# Patient Record
Sex: Female | Born: 1970 | Race: White | Hispanic: No | Marital: Married | Smoking: Never smoker
Health system: Southern US, Community
[De-identification: ages and names within clinical notes are randomized; demographics above are authoritative.]

## PROBLEM LIST (undated history)

## (undated) DIAGNOSIS — I493 Ventricular premature depolarization: Secondary | ICD-10-CM

## (undated) DIAGNOSIS — E785 Hyperlipidemia, unspecified: Secondary | ICD-10-CM

## (undated) DIAGNOSIS — G4730 Sleep apnea, unspecified: Secondary | ICD-10-CM

## (undated) DIAGNOSIS — E282 Polycystic ovarian syndrome: Secondary | ICD-10-CM

## (undated) HISTORY — DX: Ventricular premature depolarization: I49.3

## (undated) HISTORY — DX: Sleep apnea, unspecified: G47.30

## (undated) HISTORY — DX: Polycystic ovarian syndrome: E28.2

## (undated) HISTORY — DX: Hyperlipidemia, unspecified: E78.5

---
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
## 2006-03-19 HISTORY — PX: UTERINE FIBROID SURGERY: SHX826

## 2007-12-30 HISTORY — PX: LASIK: SHX215

## 2008-03-20 ENCOUNTER — Ambulatory Visit: Payer: Self-pay | Admitting: Internal Medicine

## 2008-10-11 HISTORY — PX: HERNIA REPAIR: SHX51

## 2011-08-06 ENCOUNTER — Ambulatory Visit: Payer: Self-pay | Admitting: Internal Medicine

## 2011-09-10 ENCOUNTER — Ambulatory Visit: Payer: Self-pay | Admitting: Obstetrics and Gynecology

## 2012-09-15 ENCOUNTER — Ambulatory Visit: Payer: Self-pay | Admitting: Obstetrics and Gynecology

## 2013-07-29 ENCOUNTER — Ambulatory Visit: Payer: Self-pay | Admitting: Specialist

## 2013-09-20 ENCOUNTER — Ambulatory Visit: Payer: Self-pay | Admitting: Obstetrics and Gynecology

## 2014-08-22 ENCOUNTER — Other Ambulatory Visit: Payer: Self-pay | Admitting: Internal Medicine

## 2014-08-22 LAB — D-DIMER(ARMC): D-Dimer: 284 ng/ml

## 2014-09-27 ENCOUNTER — Ambulatory Visit: Payer: Self-pay | Admitting: Internal Medicine

## 2014-09-28 HISTORY — PX: BREAST BIOPSY: SHX20

## 2014-10-02 ENCOUNTER — Ambulatory Visit: Payer: Self-pay | Admitting: Internal Medicine

## 2014-10-04 DIAGNOSIS — E8881 Metabolic syndrome: Secondary | ICD-10-CM | POA: Insufficient documentation

## 2014-10-19 ENCOUNTER — Ambulatory Visit (INDEPENDENT_AMBULATORY_CARE_PROVIDER_SITE_OTHER): Payer: BC Managed Care – PPO | Admitting: General Surgery

## 2014-10-19 ENCOUNTER — Encounter: Payer: Self-pay | Admitting: General Surgery

## 2014-10-19 VITALS — BP 128/72 | HR 74 | Resp 14 | Ht 63.0 in | Wt 271.0 lb

## 2014-10-19 DIAGNOSIS — R928 Other abnormal and inconclusive findings on diagnostic imaging of breast: Secondary | ICD-10-CM

## 2014-10-19 NOTE — Patient Instructions (Addendum)
Continue self breast exams. Call office for any new breast issues or concerns.  Stereotactic Breast Biopsy A stereotactic breast biopsy is a procedure in which mammography is used in the collection of a sample of breast tissue. Mammography is a type of X-ray exam of the breasts that produces an image called a mammogram. The mammogram allows your health care provider to precisely locate the area of the breast from which a tissue sample will be taken. The tissue is then examined under a microscope to see if cancerous cells are present. A breast biopsy is done when:   A lump, abnormality, or mass is seen in the breast on a breast X-ray (mammogram).   Small calcium deposits (calcifications) are seen in the breast.   The shape or appearance of the breasts changes.   The shape or appearance of the nipples changes. You may have unusual or bloody discharge coming from the nipples, or you may have crusting, retraction, or dimpling of the nipples. A breast biopsy can indicate if you need surgery or other treatment.  LET YOUR HEALTH CARE PROVIDER KNOW ABOUT:  Any allergies you have.  All medicines you are taking, including vitamins, herbs, eye drops, creams, and over-the-counter medicines.  Previous problems you or members of your family have had with the use of anesthetics.  Any blood disorders you have.  Previous surgeries you have had.  Medical conditions you have. RISKS AND COMPLICATIONS Generally, stereotactic breast biopsy is a safe procedure. However, as with any procedure, complications can occur. Possible complications include:  Infection at the needle-insertion site.   Bleeding or bruising after surgery.  The breast may become altered or deformed as a result of the procedure.  The needle may go through the chest wall into the lung area.  BEFORE THE PROCEDURE  Wear a supportive bra to the procedure.  You will be asked to remove jewelry, dentures, eyeglasses, metal objects,  or clothing that might interfere with the X-ray images. You may want to leave some of these objects at home.  Arrange for someone to drive you home after the procedure if desired. PROCEDURE  A stereotactic breast biopsy is done while you are awake. During the procedure, relax as much as possible. Let your health care provider know if you are uncomfortable, anxious, or in pain. Usually, the only discomfort felt during the procedure is caused by staying in one position for the length of the procedure. This discomfort can be reduced by carefully placed cushions. Most of the time the biopsy is done using a table with openings on it. You will be asked to lie facedown on the table and place your breasts through the openings. Your breast is compressed between metal plates to get good X-ray images. Your skin will be cleaned, and a numbing medicine (local anesthetic) will be injected. A small cut (incision) will be made in your breast. The tip of the biopsy needle will be directed through the incision. Several small pieces of suspicious tissue will be taken. Then, a final set of X-ray images will be obtained. If they show that the suspicious tissue has been mostly or completely removed, a small clip will be left at the biopsy site. This is done so that the biopsy site can be easily located if the results of the biopsy show that the tissue is cancerous.  After the procedure, the incision will be stitched (sutured) or taped and covered with a bandage (dressing). Your health care provider may apply a pressure dressing and   an ice pack to prevent bleeding and swelling in the breast.  A stereotactic breast biopsy can take 30 minutes or more. AFTER THE PROCEDURE  If you are doing well and have no problems, you will be allowed to go home.  Document Released: 09/13/2003 Document Revised: 12/20/2013 Document Reviewed: 07/14/2013 ExitCare Patient Information 2015 [employer]. This information is not intended to replace  advice given to you by your health care provider. Make sure you discuss any questions you have with your health care provider.  

## 2014-10-19 NOTE — Progress Notes (Signed)
Patient ID: Erin Miller, female   DOB: 11-20-1971, 43 y.o.   MRN: [id number]  Chief Complaint  Patient presents with  . Other    New Pt Cat 3 mammogram    HPI Erin Miller is a 43 y.o. female.  who presents for a breast evaluation. The most recent mammogram was 09-27-14. Left breast mammogram and ultrasound was done on 10-02-14.  Patient does not consistently perform regular self breast checks but she gets regular mammograms done.  Denies any breast trauma or injury. Denies family history of breast cancer. She states they told her that it was a concern for a lymph node in the lateral left breast. She states she can not feel the node. Her two girls are 7 and 5 and she did breast feed the last for 15 months. Husband is Dallas Schimke, RN works in the Education [employer] at [employer].   HPI  Past Medical History  Diagnosis Date  . Hyperlipidemia   . Sleep apnea   . Polycystic ovarian syndrome   . PVC (premature ventricular contraction)     Past Surgical History  Procedure Laterality Date  . Appendectomy  1981  . Uterine fibroid surgery  03-19-06  . Lasik Bilateral 2009  . Hernia repair  [id number]    umbilical  . Cesarean section  2008, 2010    No family history on file.  Social History History  Substance Use Topics  . Smoking status: Never Smoker   . Smokeless tobacco: Never Used  . Alcohol Use: No    Allergies  Allergen Reactions  . Sulfa Antibiotics Rash    Current Outpatient Prescriptions  Medication Sig Dispense Refill  . acetaminophen (TYLENOL) 325 MG tablet Take 650 mg by mouth every 6 (six) hours as needed.      Marland Kitchen atorvastatin (LIPITOR) 10 MG tablet Take 10 mg by mouth daily.       Marland Kitchen ibuprofen (ADVIL,MOTRIN) 200 MG tablet Take 200 mg by mouth every 6 (six) hours as needed.      . loratadine (CLARITIN) 10 MG tablet Take 1 tablet by mouth once a day as directed [For sneezing/allergy]      . magnesium oxide (MAG-OX) 400 MG tablet Take 400 mg by mouth daily.       .  metFORMIN (GLUCOPHAGE) 1000 MG tablet Take 1,000 mg by mouth 3 (three) times daily.       . metoprolol succinate (TOPROL-XL) 25 MG 24 hr tablet Take 25 mg by mouth daily.       . Multiple Vitamin (MULTIVITAMIN) capsule Take 1 capsule by mouth daily.      . pseudoephedrine (SUDAFED) 30 MG tablet Take 30 mg by mouth every 4 (four) hours as needed for congestion.       No current facility-administered medications for this visit.    Review of Systems Review of Systems  Blood pressure 128/72, pulse 74, resp. rate 14, height 5\' 3"  (1.6 m), weight 271 lb (122.925 kg), last menstrual period 10/19/2014.  Physical Exam Physical Exam  Constitutional: She is oriented to person, place, and time. She appears well-developed and well-nourished.  Neck: Neck supple.  Cardiovascular: Normal rate, regular rhythm and normal heart sounds.   Pulmonary/Chest: Effort normal and breath sounds normal. Right breast exhibits no inverted nipple, no mass, no nipple discharge, no skin change and no tenderness. Left breast exhibits no inverted nipple, no mass, no nipple discharge, no skin change and no tenderness.  Lymphadenopathy:    She has no cervical  adenopathy.    She has no axillary adenopathy.  Neurological: She is alert and oriented to person, place, and time.  Skin: Skin is warm and dry.    Data Reviewed Mammograms and ultrasounds from 2013 to present.  Area visualized on MLO view this year not encompassed in 2013 films, but should have been seen in 2014. Mammogram identified two areas in the UOQ of the left breast, only one noted on ultrasound.2  Assessment    Possible lymph node vs more sinister process.     Plan    The posterior location of the lesion (s) may make stereotactic biopsy difficult on the [facility] equipment.  Options reviewed: Observation as recommended by radiologist, ultrasound biopsy of the area w/ post biopsy mammograms to confirm area on mammogram correlates with ultrasound or attempted  stereotactic biopsy to assess the area that triggered the present cascade of imaging studies, with fallback to ultrasound/ open biopsy if not assessable.  This is a low probability lesion, but the patient is anxious, she will likely require biopsy for peace of mind.      Follow up    PCP/Ref: Damaris Hippo 10/22/2014, 8:03 AM

## 2014-10-22 DIAGNOSIS — R928 Other abnormal and inconclusive findings on diagnostic imaging of breast: Secondary | ICD-10-CM | POA: Insufficient documentation

## 2014-10-26 ENCOUNTER — Telehealth: Payer: Self-pay

## 2014-10-26 NOTE — Telephone Encounter (Signed)
The patient called and had some questions regarding her biopsy options that you had spoken with her about. She specifically wanted to know what would happen if a stereo biopsy was unsuccessful and if the secondary ultrasound biopsy could be done while she is there already numbed up. She asked that you call her on her mobile phone.

## 2014-10-30 ENCOUNTER — Encounter: Payer: Self-pay | Admitting: *Deleted

## 2014-10-30 ENCOUNTER — Encounter: Payer: Self-pay | Admitting: General Surgery

## 2014-10-30 NOTE — Telephone Encounter (Signed)
Patient desires to proceed with biopsy. Will set up for stereo, and if the area is too posterior to biopsy, will arrange to have her come to the office for an ultrasound and ultrasound guided biopsy.

## 2014-10-30 NOTE — Progress Notes (Signed)
Patient ID: Erin Miller, female   DOB: 13-Oct-1971, 43 y.o.   MRN: [id number]  Patient has been scheduled for a left breast stereotactic biopsy at [facility] for 11-06-14 at 3 pm. She will check-in at the [facility] at 2:30 pm. This patient is aware of date, time, and instructions. Patient verbalizes understanding.

## 2014-11-06 ENCOUNTER — Ambulatory Visit: Payer: Self-pay | Admitting: General Surgery

## 2014-11-06 DIAGNOSIS — R920 Mammographic microcalcification found on diagnostic imaging of breast: Secondary | ICD-10-CM

## 2014-11-07 ENCOUNTER — Telehealth: Payer: Self-pay | Admitting: General Surgery

## 2014-11-07 ENCOUNTER — Encounter: Payer: Self-pay | Admitting: General Surgery

## 2014-11-07 NOTE — Telephone Encounter (Signed)
Pathology showed only a lymph node. Patient reports modest soreness today as well as a spot of blood on the dressing this AM. F/U next week for staff wound check is in place.

## 2014-11-08 ENCOUNTER — Encounter: Payer: Self-pay | Admitting: General Surgery

## 2014-11-13 ENCOUNTER — Ambulatory Visit (INDEPENDENT_AMBULATORY_CARE_PROVIDER_SITE_OTHER): Payer: Self-pay | Admitting: *Deleted

## 2014-11-13 DIAGNOSIS — R928 Other abnormal and inconclusive findings on diagnostic imaging of breast: Secondary | ICD-10-CM

## 2014-11-13 NOTE — Progress Notes (Signed)
Patient here today for follow up post left  breast stereo. She has a rash wear the tegaderm was. Patient will try some hydrocortisone cream.   Minimal bruising noted.  The patient is aware that a heating pad may be used for comfort as needed.  Aware of pathology. Follow up as scheduled.

## 2015-02-22 ENCOUNTER — Ambulatory Visit: Payer: Self-pay | Admitting: Internal Medicine

## 2015-03-05 ENCOUNTER — Telehealth: Payer: Self-pay | Admitting: *Deleted

## 2015-03-05 NOTE — Telephone Encounter (Signed)
Patient called and was under the assumption that since she had a stereo biopsy in October she was supposed to be back on her yearly mammogram schedule. She is scheduled for a Uni L Diag Mammo on 04/19/15 R92.8. Please advise if she needs to keep this appointment or be in Recalls for October.

## 2015-03-05 NOTE — Telephone Encounter (Signed)
Patient can resume annual screening mammograms with her primary care provider in fall 2016. She is welcome to return if new issues identified.

## 2015-04-19 ENCOUNTER — Ambulatory Visit: Payer: BC Managed Care – PPO | Admitting: General Surgery

## 2015-04-21 NOTE — Op Note (Signed)
PATIENT NAME:  Erin Miller, Erin Miller MR#:  [id number] DATE OF BIRTH:  March 17, 1971  DATE OF PROCEDURE:  11/06/2014  PREOPERATIVE DIAGNOSIS: Abnormal left breast mammogram.   POSTOPERATIVE DIAGNOSIS: Abnormal left breast mammogram.   OPERATIVE PROCEDURE: Left breast stereotactic biopsy.   SURGEON: Hervey Ard, MD  ANESTHESIA: 1% Xylocaine with 1:200,000 units epinephrine, 30 mL.   ESTIMATED BLOOD LOSS: Less than 5 mL.   CLINICAL NOTE: This 44 year old woman recently had a mammogram showing a nodular density well posterior and lateral in the left breast. Ultrasound reported a visualized lesion, but on review of the images the area was much better seen on mammography. She was felt to be a candidate for attempted stereotactic biopsy. The possibility of an inability to biopsy based on location had been reviewed.   DESCRIPTION OF PROCEDURE: With the patient on the mammogram stereotactic table, it was necessary to reposition her to bring the left arm through the aperture at which time visualization was obtained on the scout as well as the -15 view. The lesion was just seen at the +15 view. The area was targeted after local anesthetic was infiltrated, and the initial targeting showed the needle well below the area. Repeat targeting was undertaken and the second incision was approximately 1.5 cm superior to the first. The needle was advanced and the patient experienced discomfort after needle advancement. This was managed with supplemental local anesthetic. Approximately 12 core samples were obtained, with additional local anesthetic injected multiple times. Post-biopsy imaging showed a blood pocket corresponding to the site of the previous mass, especially appreciated on the +15 view. A cylinder clip was placed. Post-biopsy mammograms are pending at the time of this dictation.   The patient will be contacted when pathology is available.  ____________________________ Robert Bellow,  MD jwb:sb D: 11/06/2014 16:22:49 ET T: 11/06/2014 17:23:52 ET JOB#: [id number]  cc: Robert Bellow, MD, <Dictator> Leonie Douglas. Doy Hutching, MD Marcea Rojek Amedeo Kinsman MD ELECTRONICALLY SIGNED 11/06/2014 20:13

## 2015-04-23 LAB — SURGICAL PATHOLOGY

## 2015-09-04 ENCOUNTER — Other Ambulatory Visit: Payer: Self-pay | Admitting: Internal Medicine

## 2015-09-04 DIAGNOSIS — Z9889 Other specified postprocedural states: Secondary | ICD-10-CM

## 2015-10-01 ENCOUNTER — Other Ambulatory Visit: Payer: Self-pay

## 2015-10-01 ENCOUNTER — Ambulatory Visit: Payer: Self-pay

## 2015-10-04 ENCOUNTER — Other Ambulatory Visit: Payer: Self-pay | Admitting: Internal Medicine

## 2015-10-04 ENCOUNTER — Ambulatory Visit
Admission: RE | Admit: 2015-10-04 | Discharge: 2015-10-04 | Disposition: A | Discharge status: Home / Self Care | Payer: BC Managed Care – PPO | Source: Ambulatory Visit | Attending: Internal Medicine | Admitting: Internal Medicine

## 2015-10-04 DIAGNOSIS — R928 Other abnormal and inconclusive findings on diagnostic imaging of breast: Secondary | ICD-10-CM | POA: Diagnosis not present

## 2015-10-04 DIAGNOSIS — Z9889 Other specified postprocedural states: Secondary | ICD-10-CM

## 2015-11-14 ENCOUNTER — Emergency Department: Payer: BC Managed Care – PPO

## 2015-11-14 ENCOUNTER — Emergency Department
Admission: EM | Admit: 2015-11-14 | Discharge: 2015-11-14 | Disposition: A | Discharge status: Home / Self Care | Payer: BC Managed Care – PPO | Attending: Emergency Medicine | Admitting: Emergency Medicine

## 2015-11-14 DIAGNOSIS — R0602 Shortness of breath: Secondary | ICD-10-CM | POA: Insufficient documentation

## 2015-11-14 DIAGNOSIS — R079 Chest pain, unspecified: Secondary | ICD-10-CM | POA: Diagnosis not present

## 2015-11-14 DIAGNOSIS — Z7984 Long term (current) use of oral hypoglycemic drugs: Secondary | ICD-10-CM | POA: Diagnosis not present

## 2015-11-14 DIAGNOSIS — Z79899 Other long term (current) drug therapy: Secondary | ICD-10-CM | POA: Diagnosis not present

## 2015-11-14 DIAGNOSIS — E119 Type 2 diabetes mellitus without complications: Secondary | ICD-10-CM | POA: Diagnosis not present

## 2015-11-14 LAB — BASIC METABOLIC PANEL
ANION GAP: 8 (ref 5–15)
BUN: 13 mg/dL (ref 6–20)
CALCIUM: 9.3 mg/dL (ref 8.9–10.3)
CHLORIDE: 107 mmol/L (ref 101–111)
CO2: 25 mmol/L (ref 22–32)
Creatinine, Ser: 0.92 mg/dL (ref 0.44–1.00)
GFR calc Af Amer: >60 mL/min (ref >60)
GFR calc non Af Amer: >60 mL/min (ref >60)
GLUCOSE: 104 mg/dL — AB (ref 65–99)
Potassium: 4.2 mmol/L (ref 3.5–5.1)
Sodium: 140 mmol/L (ref 135–145)

## 2015-11-14 LAB — CBC
HCT: 39.9 % (ref 35.0–47.0)
Hemoglobin: 13.1 g/dL (ref 12.0–16.0)
MCH: 28.3 pg (ref 26.0–34.0)
MCHC: 32.8 g/dL (ref 32.0–36.0)
MCV: 86.4 fL (ref 80.0–100.0)
PLATELETS: 201 10*3/uL (ref 150–440)
RBC: 4.62 MIL/uL (ref 3.80–5.20)
RDW: 14.7 % — AB (ref 11.5–14.5)
WBC: 7.9 10*3/uL (ref 3.6–11.0)

## 2015-11-14 LAB — TROPONIN I

## 2015-11-14 LAB — FIBRIN DERIVATIVES D-DIMER (ARMC ONLY): Fibrin derivatives D-dimer (ARMC): 400 (ref 0–499)

## 2015-11-14 NOTE — Discharge Instructions (Signed)
You have been seen in the emergency department today for chest pain. Your workup has shown normal results. As we discussed please follow-up with your primary care physician in the next 1-2 days for recheck. Return to the emergency department for any further chest pain, trouble breathing, or any other symptom personally concerning to yourself. ° °Nonspecific Chest Pain °It is often hard to find the cause of chest pain. There is always a chance that your pain could be related to something serious, such as a heart attack or a blood clot in your lungs. Chest pain can also be caused by conditions that are not life-threatening. If you have chest pain, it is very important to follow up with your doctor. ° °HOME CARE °· If you were prescribed an antibiotic medicine, finish it all even if you start to feel better. °· Avoid any activities that cause chest pain. °· Do not use any tobacco products, including cigarettes, chewing tobacco, or electronic cigarettes. If you need help quitting, ask your doctor. °· Do not drink alcohol. °· Take medicines only as told by your doctor. °· Keep all follow-up visits as told by your doctor. This is important. This includes any further testing if your chest pain does not go away. °· Your doctor may tell you to keep your head raised (elevated) while you sleep. °· Make lifestyle changes as told by your doctor. These may include: °¨ Getting regular exercise. Ask your doctor to suggest some activities that are safe for you. °¨ Eating a heart-healthy diet. Your doctor or a diet specialist (dietitian) can help you to learn healthy eating options. °¨ Maintaining a healthy weight. °¨ Managing diabetes, if necessary. °¨ Reducing stress. °GET HELP IF: °· Your chest pain does not go away, even after treatment. °· You have a rash with blisters on your chest. °· You have a fever. °GET HELP RIGHT AWAY IF: °· Your chest pain is worse. °· You have an increasing cough, or you cough up blood. °· You have  severe belly (abdominal) pain. °· You feel extremely weak. °· You pass out (faint). °· You have chills. °· You have sudden, unexplained chest discomfort. °· You have sudden, unexplained discomfort in your arms, back, neck, or jaw. °· You have shortness of breath at any time. °· You suddenly start to sweat, or your skin gets clammy. °· You feel nauseous. °· You vomit. °· You suddenly feel light-headed or dizzy. °· Your heart begins to beat quickly, or it feels like it is skipping beats. °These symptoms may be an emergency. Do not wait to see if the symptoms will go away. Get medical help right away. Call your local emergency services (911 in the [place] Do not drive yourself to the hospital. °  °This information is not intended to replace advice given to you by your health care provider. Make sure you discuss any questions you have with your health care provider. °  °Document Released: 06/02/2008 Document Revised: 01/05/2015 Document Reviewed: 07/21/2014 °Elsevier Interactive Patient Education ©2016 Elsevier Inc. ° °

## 2015-11-14 NOTE — ED Provider Notes (Signed)
[facility] Emergency Department Provider Note  Time seen: 3:49 PM  I have reviewed the triage vital signs and the nursing notes.   HISTORY  Chief Complaint Chest Pain    HPI Erin Miller is a 44 y.o. female with a past medical history of hyperlipidemia, diabetes, who presents to the emergency department with intermittent chest pain for the past 3 weeks. According to the patient for the past 3 weeks she has had intermittent right chest pains. She describes them as dull pains which come and go some last minutes some last hours. States she does have occasional shortness of breath somewhat worse with exertion. Denies any nausea or diaphoresis. Patient denies any current chest pains. Denies any current shortness of breath. Denies any recent cough or congestion or fever. Denies any leg pain or swelling or history of DVT.     Past Medical History  Diagnosis Date  . Hyperlipidemia   . Sleep apnea   . Polycystic ovarian syndrome   . PVC (premature ventricular contraction)   . Sleep apnea     Patient Active Problem List   Diagnosis Date Noted  . Abnormal mammogram of left breast 10/22/2014    Past Surgical History  Procedure Laterality Date  . Appendectomy  1981  . Uterine fibroid surgery  03-19-06  . Lasik Bilateral 2009  . Hernia repair  [id number]    umbilical  . Cesarean section  2008, 2010  . Breast biopsy Left 09/2014    stereo of left breast mass done by Dr. Bary Castilla. Benign    Current Outpatient Rx  Name  Route  Sig  Dispense  Refill  . acetaminophen (TYLENOL) 325 MG tablet   Oral   Take 650 mg by mouth every 6 (six) hours as needed.         Marland Kitchen atorvastatin (LIPITOR) 10 MG tablet   Oral   Take 10 mg by mouth daily.          Marland Kitchen ibuprofen (ADVIL,MOTRIN) 200 MG tablet   Oral   Take 200 mg by mouth every 6 (six) hours as needed.         . loratadine (CLARITIN) 10 MG tablet      Take 1 tablet by mouth once a day as directed [For  sneezing/allergy]         . metFORMIN (GLUCOPHAGE) 1000 MG tablet   Oral   Take 1,000 mg by mouth 3 (three) times daily.          . metoprolol succinate (TOPROL-XL) 25 MG 24 hr tablet   Oral   Take 25 mg by mouth daily.          . Multiple Vitamin (MULTIVITAMIN) capsule   Oral   Take 1 capsule by mouth daily.         . pseudoephedrine (SUDAFED) 30 MG tablet   Oral   Take 30 mg by mouth every 4 (four) hours as needed for congestion.           Allergies Sulfa antibiotics  No family history on file.  Social History Social History  Substance Use Topics  . Smoking status: Never Smoker   . Smokeless tobacco: Never Used  . Alcohol Use: No    Review of Systems Constitutional: Negative for fever. Cardiovascular: Positive for chest pain Respiratory: Positive for intermittent shortness of breath. Gastrointestinal: Negative for abdominal pain Musculoskeletal: Negative for back pain. Negative for leg pain or swelling. Skin: Negative for rash. Neurological: Negative for headache  10-point ROS otherwise negative.  ____________________________________________   PHYSICAL EXAM:  VITAL SIGNS: ED Triage Vitals  Enc Vitals Group     BP 11/14/15 1437 140/82 mmHg     Pulse Rate 11/14/15 1437 63     Resp 11/14/15 1437 18     Temp 11/14/15 1437 98.5 F (36.9 C)     Temp Source 11/14/15 1437 Oral     SpO2 11/14/15 1437 96 %     Weight 11/14/15 1437 265 lb (120.203 kg)     Height 11/14/15 1437 5\' 3"  (1.6 m)     Head Cir --      Peak Flow --      Pain Score 11/14/15 1437 2     Pain Loc --      Pain Edu? --      Excl. in [place]? --     Constitutional: Alert and oriented. Well appearing and in no distress. Eyes: Normal exam ENT   Head: Normocephalic and atraumatic.   Mouth/Throat: Mucous membranes are moist. Cardiovascular: Normal rate, regular rhythm. No murmur Respiratory: Normal respiratory effort without tachypnea nor retractions. Breath sounds are clear and  equal bilaterally. No wheezes/rales/rhonchi. No chest tenderness to palpation Gastrointestinal: Soft and nontender. No distention. Musculoskeletal: Nontender with normal range of motion in all extremities. No lower extremity tenderness or edema. Neurologic:  Normal speech and language. No gross focal neurologic deficits  Skin:  Skin is warm, dry and intact.  Psychiatric: Mood and affect are normal. Speech and behavior are normal.   ____________________________________________    EKG  EKG reviewed and interpreted by myself shows normal sinus rhythm at 63 bpm, narrow QRS, normal axis, normal intervals, no ST changes present. Normal EKG.  ____________________________________________    RADIOLOGY  No acute findings on chest x-ray  ____________________________________________    INITIAL IMPRESSION / ASSESSMENT AND PLAN / ED COURSE  Pertinent labs & imaging results that were available during my care of the patient were reviewed by me and considered in my medical decision making (see chart for details).  Patient presents the department intermittent right-sided chest pain and shortness of breath. Patient currently has a 96% room air saturation. Nonreproducible chest pain. Denies any chest pain or shortness of breath currently. Denies any pleuritic nature of the chest pain. Given the patient's lower oxygen saturations along with intermittent right-sided symptoms associated with shortness of breath we'll proceed with a d-dimer to rule out PE. Currently the patient's labs including troponin are negative. EKG looks normal, chest x-ray is normal. Patient states she's had several stress tests in the past due to various chest pain or arm symptoms develop and normal. Her last stress test she states was over 3 years ago.  D-dimer negative. We'll discharge home with PCP follow-up for stress test. Patient agreeable to plan.  ____________________________________________   FINAL CLINICAL  IMPRESSION(S) / ED DIAGNOSES  Chest pain   Harvest Dark, MD 11/14/15 [id number]

## 2015-11-14 NOTE — ED Notes (Signed)
Pt c/o intermittent chest pain that radiates into the back and shoulders over the past week, states she is having increased SOB with excursion.. Pt is in NAD at present

## 2015-11-14 NOTE — ED Notes (Signed)
Pt states chest pain on and off for several days, states some occasional SOB and dizziness, pt states she has been under stress but pain has been moving from under breast and rib cage, pt awake and alert in no acute distress, MD at bedside

## 2016-03-03 DIAGNOSIS — E669 Obesity, unspecified: Secondary | ICD-10-CM | POA: Insufficient documentation

## 2016-09-08 ENCOUNTER — Other Ambulatory Visit: Payer: Self-pay | Admitting: Internal Medicine

## 2016-09-08 DIAGNOSIS — Z1231 Encounter for screening mammogram for malignant neoplasm of breast: Secondary | ICD-10-CM

## 2016-10-15 ENCOUNTER — Ambulatory Visit
Admission: RE | Admit: 2016-10-15 | Discharge: 2016-10-15 | Disposition: A | Discharge status: Home / Self Care | Payer: BC Managed Care – PPO | Source: Ambulatory Visit | Attending: Internal Medicine | Admitting: Internal Medicine

## 2016-10-15 DIAGNOSIS — Z1231 Encounter for screening mammogram for malignant neoplasm of breast: Secondary | ICD-10-CM | POA: Diagnosis not present

## 2017-06-30 ENCOUNTER — Other Ambulatory Visit: Payer: Self-pay | Admitting: Obstetrics and Gynecology

## 2017-06-30 DIAGNOSIS — Z1231 Encounter for screening mammogram for malignant neoplasm of breast: Secondary | ICD-10-CM

## 2017-10-21 ENCOUNTER — Ambulatory Visit
Admission: RE | Admit: 2017-10-21 | Discharge: 2017-10-21 | Disposition: A | Discharge status: Home / Self Care | Payer: BC Managed Care – PPO | Source: Ambulatory Visit | Attending: Obstetrics and Gynecology | Admitting: Obstetrics and Gynecology

## 2017-10-21 DIAGNOSIS — Z1231 Encounter for screening mammogram for malignant neoplasm of breast: Secondary | ICD-10-CM

## 2018-09-23 ENCOUNTER — Other Ambulatory Visit: Payer: Self-pay | Admitting: Obstetrics and Gynecology

## 2018-09-23 ENCOUNTER — Other Ambulatory Visit: Payer: Self-pay | Admitting: Internal Medicine

## 2018-09-23 DIAGNOSIS — Z1231 Encounter for screening mammogram for malignant neoplasm of breast: Secondary | ICD-10-CM

## 2018-10-26 ENCOUNTER — Ambulatory Visit
Admission: RE | Admit: 2018-10-26 | Discharge: 2018-10-26 | Disposition: A | Discharge status: Home / Self Care | Payer: BC Managed Care – PPO | Source: Ambulatory Visit | Attending: Internal Medicine | Admitting: Internal Medicine

## 2018-10-26 DIAGNOSIS — Z1231 Encounter for screening mammogram for malignant neoplasm of breast: Secondary | ICD-10-CM | POA: Diagnosis not present

## 2019-08-31 ENCOUNTER — Other Ambulatory Visit: Payer: Self-pay | Admitting: Internal Medicine

## 2019-08-31 DIAGNOSIS — Z1231 Encounter for screening mammogram for malignant neoplasm of breast: Secondary | ICD-10-CM

## 2019-11-02 ENCOUNTER — Ambulatory Visit
Admission: RE | Admit: 2019-11-02 | Discharge: 2019-11-02 | Disposition: A | Discharge status: Home / Self Care | Payer: BC Managed Care – PPO | Source: Ambulatory Visit | Attending: Internal Medicine | Admitting: Internal Medicine

## 2019-11-02 DIAGNOSIS — Z1231 Encounter for screening mammogram for malignant neoplasm of breast: Secondary | ICD-10-CM | POA: Insufficient documentation

## 2020-03-16 ENCOUNTER — Ambulatory Visit: Payer: BC Managed Care – PPO | Attending: Internal Medicine

## 2020-03-16 DIAGNOSIS — Z23 Encounter for immunization: Secondary | ICD-10-CM

## 2020-03-16 NOTE — Progress Notes (Signed)
   Covid-19 Vaccination Clinic  Name:  Erin Miller    MRN: [id number] DOB: 10-30-1971  03/16/2020  Erin Miller was observed post Covid-19 immunization for 15 minutes without incident. She was provided with Vaccine Information Sheet and instruction to access the V-Safe system.   Erin Miller was instructed to call 911 with any severe reactions post vaccine: Marland Kitchen Difficulty breathing  . Swelling of face and throat  . A fast heartbeat  . A bad rash all over body  . Dizziness and weakness   Immunizations Administered    Name Date Dose VIS Date Route   Pfizer COVID-19 Vaccine 03/16/2020  1:52 PM 0.3 mL 12/09/2019 Intramuscular   Manufacturer: [place]   Lot: [id number]   [place]: [id number]

## 2020-04-11 ENCOUNTER — Ambulatory Visit: Payer: BC Managed Care – PPO | Attending: Internal Medicine

## 2020-04-11 DIAGNOSIS — Z23 Encounter for immunization: Secondary | ICD-10-CM

## 2020-04-11 NOTE — Progress Notes (Signed)
   Covid-19 Vaccination Clinic  Name:  Erin Miller    MRN: [id number] DOB: Jun 16, 1971  04/11/2020  [name] was observed post Covid-19 immunization for 15 minutes without incident. She was provided with Vaccine Information Sheet and instruction to access the V-Safe system.   [name] was instructed to call 911 with any severe reactions post vaccine: Marland Kitchen Difficulty breathing  . Swelling of face and throat  . A fast heartbeat  . A bad rash all over body  . Dizziness and weakness   Immunizations Administered    Name Date Dose VIS Date Route   Pfizer COVID-19 Vaccine 04/11/2020  3:08 PM 0.3 mL 12/09/2019 Intramuscular   Manufacturer: [place]   Lot: [id number]   [place]: [id number]

## 2020-05-22 ENCOUNTER — Other Ambulatory Visit: Payer: Self-pay

## 2020-05-22 DIAGNOSIS — I10 Essential (primary) hypertension: Secondary | ICD-10-CM | POA: Insufficient documentation

## 2020-05-22 DIAGNOSIS — E785 Hyperlipidemia, unspecified: Secondary | ICD-10-CM | POA: Insufficient documentation

## 2020-05-22 DIAGNOSIS — I493 Ventricular premature depolarization: Secondary | ICD-10-CM | POA: Insufficient documentation

## 2020-05-22 DIAGNOSIS — J45909 Unspecified asthma, uncomplicated: Secondary | ICD-10-CM | POA: Insufficient documentation

## 2020-05-22 DIAGNOSIS — E282 Polycystic ovarian syndrome: Secondary | ICD-10-CM | POA: Insufficient documentation

## 2020-05-22 DIAGNOSIS — N898 Other specified noninflammatory disorders of vagina: Secondary | ICD-10-CM | POA: Insufficient documentation

## 2020-05-24 ENCOUNTER — Telehealth (INDEPENDENT_AMBULATORY_CARE_PROVIDER_SITE_OTHER): Payer: Self-pay | Admitting: Gastroenterology

## 2020-05-24 DIAGNOSIS — Z1211 Encounter for screening for malignant neoplasm of colon: Secondary | ICD-10-CM

## 2020-05-24 NOTE — Progress Notes (Signed)
Gastroenterology Pre-Procedure Review  Request Date: 07/09/20 Requesting Physician: Dr. Allen Norris  PATIENT REVIEW QUESTIONS: The patient responded to the following health history questions as indicated:    1. Are you having any GI issues? no 2. Do you have a personal history of Polyps? no 3. Do you have a family history of Colon Cancer or Polyps? no 4. Diabetes Mellitus? no 5. Joint replacements in the past 12 months?no 6. Major health problems in the past 3 months?no 7. Any artificial heart valves, MVP, or defibrillator?no    MEDICATIONS & ALLERGIES:    Patient reports the following regarding taking any anticoagulation/antiplatelet therapy:   Plavix, Coumadin, Eliquis, Xarelto, Lovenox, Pradaxa, Brilinta, or Effient? no Aspirin? no  Patient confirms/reports the following medications:  Current Outpatient Medications  Medication Sig Dispense Refill  . atorvastatin (LIPITOR) 10 MG tablet Take 10 mg by mouth daily.     . Bacillus Coagulans-Inulin (ALIGN PREBIOTIC-PROBIOTIC PO) Take by mouth.    . bisoprolol-hydrochlorothiazide (ZIAC) 5-6.25 MG tablet     . Cholecalciferol 125 MCG (5000 UT) TABS Take by mouth.    . cyanocobalamin (,VITAMIN B-12,) 1000 MCG/ML injection Inject into the muscle.    . fluticasone (FLONASE) 50 MCG/ACT nasal spray Place 2 sprays into both nostrils daily.    . Ibuprofen (ADVIL PO) Take by mouth.    Marland Kitchen ibuprofen (ADVIL,MOTRIN) 200 MG tablet Take 200 mg by mouth every 6 (six) hours as needed.    . magnesium oxide (MAG-OX) 400 MG tablet Take 400 mg by mouth daily.    . metFORMIN (GLUCOPHAGE) 1000 MG tablet Take 1,000 mg by mouth 3 (three) times daily.     . pseudoephedrine (SUDAFED) 30 MG tablet Take 30 mg by mouth every 4 (four) hours as needed for congestion.    Marland Kitchen acetaminophen (TYLENOL) 325 MG tablet Take 650 mg by mouth every 6 (six) hours as needed.    . clobetasol ointment (TEMOVATE) 0.05 % clobetasol 0.05 % topical ointment    . loratadine (CLARITIN) 10 MG  tablet Take 1 tablet by mouth once a day as directed [For sneezing/allergy]    . medroxyPROGESTERone (PROVERA) 10 MG tablet     . Multiple Vitamin (MULTIVITAMIN) capsule Take 1 capsule by mouth daily.    . Omega-3 Fatty Acids (FISH OIL) 1000 MG CAPS Take by mouth.     No current facility-administered medications for this visit.    Patient confirms/reports the following allergies:  Allergies  Allergen Reactions  . Terconazole   . Sulfa Antibiotics Rash  . Sulfisoxazole Rash    No orders of the defined types were placed in this encounter.   AUTHORIZATION INFORMATION Primary Insurance: 1D#: Group #:  Secondary Insurance: 1D#: Group #:  SCHEDULE INFORMATION: Date: 07/09/20 Time: Location: [place]

## 2020-05-25 ENCOUNTER — Telehealth: Payer: Self-pay

## 2020-05-25 DIAGNOSIS — Z1211 Encounter for screening for malignant neoplasm of colon: Secondary | ICD-10-CM

## 2020-05-25 NOTE — Telephone Encounter (Signed)
Colonoscopy has been rescheduled to July 9th, due to husbands work schedule.  New instructions will be mailed to reflect the date change. Referral updated. [place] notified. Pt advised of new COVID Test date 07/04/20.  Thanks,  [place], [place]

## 2020-06-26 ENCOUNTER — Other Ambulatory Visit: Payer: Self-pay

## 2020-06-26 DIAGNOSIS — Z1211 Encounter for screening for malignant neoplasm of colon: Secondary | ICD-10-CM

## 2020-06-26 MED ORDER — SUTAB 1479-225-188 MG PO TABS: 376.0000 mg | ORAL_TABLET | ORAL | 0 refills | Status: AC

## 2020-06-29 ENCOUNTER — Other Ambulatory Visit
Admission: RE | Admit: 2020-06-29 | Discharge: 2020-06-29 | Disposition: A | Discharge status: Home / Self Care | Payer: BC Managed Care – PPO | Source: Ambulatory Visit | Attending: Gastroenterology | Admitting: Gastroenterology

## 2020-06-29 ENCOUNTER — Other Ambulatory Visit: Payer: Self-pay

## 2020-06-29 DIAGNOSIS — Z20822 Contact with and (suspected) exposure to covid-19: Secondary | ICD-10-CM | POA: Insufficient documentation

## 2020-06-29 DIAGNOSIS — Z01812 Encounter for preprocedural laboratory examination: Secondary | ICD-10-CM | POA: Insufficient documentation

## 2020-06-29 LAB — SARS CORONAVIRUS 2 (TAT 6-24 HRS): SARS Coronavirus 2: NEGATIVE

## 2020-07-03 ENCOUNTER — Encounter: Payer: Self-pay | Admitting: Gastroenterology

## 2020-07-03 ENCOUNTER — Ambulatory Visit
Admission: RE | Admit: 2020-07-03 | Discharge: 2020-07-03 | Disposition: A | Discharge status: Home / Self Care | Payer: BC Managed Care – PPO | Attending: Gastroenterology | Admitting: Gastroenterology

## 2020-07-03 ENCOUNTER — Encounter: Payer: Self-pay | Admitting: Anesthesiology

## 2020-07-03 ENCOUNTER — Encounter
Admission: RE | Disposition: A | Discharge status: Home / Self Care | Payer: Self-pay | Source: Home / Self Care | Attending: Gastroenterology

## 2020-07-03 DIAGNOSIS — J45909 Unspecified asthma, uncomplicated: Secondary | ICD-10-CM | POA: Diagnosis not present

## 2020-07-03 DIAGNOSIS — Z79899 Other long term (current) drug therapy: Secondary | ICD-10-CM | POA: Diagnosis not present

## 2020-07-03 DIAGNOSIS — Z7984 Long term (current) use of oral hypoglycemic drugs: Secondary | ICD-10-CM | POA: Diagnosis not present

## 2020-07-03 DIAGNOSIS — D124 Benign neoplasm of descending colon: Secondary | ICD-10-CM | POA: Diagnosis not present

## 2020-07-03 DIAGNOSIS — Z1211 Encounter for screening for malignant neoplasm of colon: Secondary | ICD-10-CM | POA: Insufficient documentation

## 2020-07-03 DIAGNOSIS — G4730 Sleep apnea, unspecified: Secondary | ICD-10-CM | POA: Insufficient documentation

## 2020-07-03 DIAGNOSIS — Z6841 Body Mass Index (BMI) 40.0 and over, adult: Secondary | ICD-10-CM | POA: Diagnosis not present

## 2020-07-03 DIAGNOSIS — K635 Polyp of colon: Secondary | ICD-10-CM

## 2020-07-03 DIAGNOSIS — K5730 Diverticulosis of large intestine without perforation or abscess without bleeding: Secondary | ICD-10-CM | POA: Diagnosis not present

## 2020-07-03 DIAGNOSIS — E282 Polycystic ovarian syndrome: Secondary | ICD-10-CM | POA: Insufficient documentation

## 2020-07-03 DIAGNOSIS — Z882 Allergy status to sulfonamides status: Secondary | ICD-10-CM | POA: Diagnosis not present

## 2020-07-03 DIAGNOSIS — E785 Hyperlipidemia, unspecified: Secondary | ICD-10-CM | POA: Insufficient documentation

## 2020-07-03 DIAGNOSIS — Z888 Allergy status to other drugs, medicaments and biological substances status: Secondary | ICD-10-CM | POA: Insufficient documentation

## 2020-07-03 DIAGNOSIS — I10 Essential (primary) hypertension: Secondary | ICD-10-CM | POA: Insufficient documentation

## 2020-07-03 HISTORY — PX: COLONOSCOPY WITH PROPOFOL: SHX5780

## 2020-07-03 LAB — POCT PREGNANCY, URINE: Preg Test, Ur: NEGATIVE

## 2020-07-03 LAB — GLUCOSE, CAPILLARY: Glucose-Capillary: 126 mg/dL — ABNORMAL HIGH (ref 70–99)

## 2020-07-03 SURGERY — COLONOSCOPY WITH PROPOFOL
Anesthesia: General

## 2020-07-03 MED ORDER — LIDOCAINE HCL (CARDIAC) PF 100 MG/5ML IV SOSY
PREFILLED_SYRINGE | INTRAVENOUS | Status: DC | PRN
  Administered 2020-07-03: 50 mg via INTRAVENOUS

## 2020-07-03 MED ORDER — SODIUM CHLORIDE 0.9 % IV SOLN: INTRAVENOUS | Status: DC

## 2020-07-03 MED ORDER — PROPOFOL 500 MG/50ML IV EMUL
INTRAVENOUS | Status: DC | PRN
  Administered 2020-07-03: 80 ug/kg/min via INTRAVENOUS

## 2020-07-03 MED ORDER — PROPOFOL 10 MG/ML IV BOLUS
INTRAVENOUS | Status: AC
  Filled 2020-07-03: qty 20

## 2020-07-03 MED ORDER — PROPOFOL 500 MG/50ML IV EMUL
INTRAVENOUS | Status: AC
  Filled 2020-07-03: qty 50

## 2020-07-03 MED ORDER — PROPOFOL 10 MG/ML IV BOLUS
INTRAVENOUS | Status: DC | PRN
  Administered 2020-07-03: 100 mg via INTRAVENOUS
  Administered 2020-07-03 (×3): 40 mg via INTRAVENOUS

## 2020-07-03 NOTE — Op Note (Signed)
[facility] Gastroenterology Patient Name: Erin Miller Procedure Date: 07/03/2020 11:13 AM MRN: [id number] Account #: [id number] Date of Birth: [birth year] Admit Type: Outpatient Age: 49 Room: [facility] ENDO ROOM 4 Gender: Female Note Status: Finalized Procedure:             Colonoscopy Indications:           Screening for colorectal malignant neoplasm Providers:             Lucilla Lame MD, MD Medicines:             Propofol per Anesthesia Complications:         No immediate complications. Procedure:             Pre-Anesthesia Assessment:                        - Prior to the procedure, a History and Physical was                         performed, and patient medications and allergies were                         reviewed. The patient's tolerance of previous                         anesthesia was also reviewed. The risks and benefits                         of the procedure and the sedation options and risks                         were discussed with the patient. All questions were                         answered, and informed consent was obtained. Prior                         Anticoagulants: The patient has taken no previous                         anticoagulant or antiplatelet agents. ASA Grade                         Assessment: II - A patient with mild systemic disease.                         After reviewing the risks and benefits, the patient                         was deemed in satisfactory condition to undergo the                         procedure.                        After obtaining informed consent, the colonoscope was                         passed under direct vision. Throughout the procedure,  the patient's blood pressure, pulse, and oxygen                         saturations were monitored continuously. The                         Colonoscope was introduced through the anus and                         advanced to the the  cecum, identified by appendiceal                         orifice and ileocecal valve. The colonoscopy was                         performed without difficulty. The patient tolerated                         the procedure well. The quality of the bowel                         preparation was excellent. Findings:      The perianal and digital rectal examinations were normal.      A 4 mm polyp was found in the descending colon. The polyp was sessile.       The polyp was removed with a cold biopsy forceps. Resection and       retrieval were complete.      A 6 mm polyp was found in the descending colon. The polyp was sessile.       The polyp was removed with a cold snare. Resection and retrieval were       complete.      A few small-mouthed diverticula were found in the sigmoid colon. Impression:            - One 4 mm polyp in the descending colon, removed with                         a cold biopsy forceps. Resected and retrieved.                        - One 6 mm polyp in the descending colon, removed with                         a cold snare. Resected and retrieved.                        - Diverticulosis in the sigmoid colon. Recommendation:        - Discharge patient to home.                        - Resume previous diet.                        - Continue present medications.                        - Await pathology results.                        -  Repeat colonoscopy in 5 years if polyp adenoma and                         10 years if hyperplastic Procedure Code(s):     --- Professional ---                        [phone], Colonoscopy, flexible; with removal of                         tumor(s), polyp(s), or other lesion(s) by snare                         technique                        45380, 37, Colonoscopy, flexible; with biopsy, single                         or multiple Diagnosis Code(s):     --- Professional ---                        Z12.11, Encounter for screening for malignant  neoplasm                         of colon                        K63.5, Polyp of colon CPT copyright 2019 American Medical Association. All rights reserved. The codes documented in this report are preliminary and upon coder review may  be revised to meet current compliance requirements. Lucilla Lame MD, MD 07/03/2020 11:49:25 AM This report has been signed electronically. Number of Addenda: 0 Note Initiated On: 07/03/2020 11:13 AM Scope Withdrawal Time: 0 hours 8 minutes 22 seconds  Total Procedure Duration: 0 hours 15 minutes 8 seconds  Estimated Blood Loss:  Estimated blood loss: none.      [facility]

## 2020-07-03 NOTE — H&P (Signed)
Lucilla Lame, MD [address] [place], [place] [address] Phone: [phone] Fax : [phone]  Primary Care Physician:  Idelle Crouch, MD Primary Gastroenterologist:  Dr. Allen Norris  Pre-Procedure History & Physical: HPI:  Erin Miller is a 49 y.o. female is here for a screening colonoscopy.   Past Medical History:  Diagnosis Date  . Hyperlipidemia   . Polycystic ovarian syndrome   . PVC (premature ventricular contraction)   . Sleep apnea   . Sleep apnea     Past Surgical History:  Procedure Laterality Date  . APPENDECTOMY  1981  . BREAST BIOPSY Left 09/2014   stereo of left breast mass done by Dr. Bary Castilla. Benign  . CESAREAN SECTION  2008, 2010  . HERNIA REPAIR  67-20-94   umbilical  . LASIK Bilateral 2009  . UTERINE FIBROID SURGERY  03-19-06    Prior to Admission medications   Medication Sig Start Date End Date Taking? Authorizing Provider  bisoprolol-hydrochlorothiazide [facility]) 5-6.25 MG tablet  05/21/20  Yes [provider]  metFORMIN (GLUCOPHAGE) 1000 MG tablet Take 1,000 mg by mouth 3 (three) times daily.    Yes [provider]  acetaminophen (TYLENOL) 325 MG tablet Take 650 mg by mouth every 6 (six) hours as needed.    [provider]  atorvastatin (LIPITOR) 10 MG tablet Take 10 mg by mouth daily.     [provider]  Bacillus Coagulans-Inulin (ALIGN PREBIOTIC-PROBIOTIC PO) Take by mouth.    [provider]  Cholecalciferol 125 MCG (5000 UT) TABS Take by mouth.    [provider]  clobetasol ointment (TEMOVATE) 0.05 % clobetasol 0.05 % topical ointment    [provider]  cyanocobalamin (,VITAMIN B-12,) 1000 MCG/ML injection Inject into the muscle. 11/15/19   [provider]  fluticasone (FLONASE) 50 MCG/ACT nasal spray Place 2 sprays into both nostrils daily. 05/21/20   [provider]  Ibuprofen (ADVIL PO) Take by mouth.    [provider]  loratadine  (CLARITIN) 10 MG tablet Take 1 tablet by mouth once a day as directed [For sneezing/allergy]    [provider]  magnesium oxide (MAG-OX) 400 MG tablet Take 400 mg by mouth daily.    [provider]  medroxyPROGESTERone (PROVERA) 10 MG tablet  05/21/20   [provider]  Multiple Vitamin (MULTIVITAMIN) capsule Take 1 capsule by mouth daily.    [provider]  Omega-3 Fatty Acids (FISH OIL) 1000 MG CAPS Take by mouth.    [provider]  pseudoephedrine (SUDAFED) 30 MG tablet Take 30 mg by mouth every 4 (four) hours as needed for congestion.    [provider]  Sodium Sulfate-Mag Sulfate-KCl (SUTAB) [phone] MG TABS Take 376 mg by mouth as directed. 06/26/20   Lucilla Lame, MD    Allergies as of 05/24/2020 - Review Complete 05/24/2020  Allergen Reaction Noted  . Terconazole  05/22/2020  . Sulfa antibiotics Rash 10/19/2014  . Sulfisoxazole Rash 05/22/2020    Family History  Problem Relation Age of Onset  . Breast cancer Neg Hx     Social History   Socioeconomic History  . Marital status: Married    Spouse name: Not on file  . Number of children: Not on file  . Years of education: Not on file  . Highest education level: Not on file  Occupational History  . Not on file  Tobacco Use  . Smoking status: Never Smoker  . Smokeless tobacco: Never Used  Substance  and Sexual Activity  . Alcohol use: No  . Drug use: No  . Sexual activity: Not on file  Other Topics Concern  . Not on file  Social History Narrative  . Not on file   Social Determinants of Health   Financial Resource Strain:   . Difficulty of Paying Living Expenses:   Food Insecurity:   . Worried About Charity fundraiser in the Last Year:   . Arboriculturist in the Last Year:   Transportation Needs:   . Film/video editor (Medical):   Marland Kitchen Lack of Transportation (Non-Medical):   Physical Activity:   . Days of Exercise per Week:   . Minutes of Exercise per  Session:   Stress:   . Feeling of Stress :   Social Connections:   . Frequency of Communication with Friends and Family:   . Frequency of Social Gatherings with Friends and Family:   . Attends Religious Services:   . Active Member of Clubs or Organizations:   . Attends Archivist Meetings:   Marland Kitchen Marital Status:   Intimate Partner Violence:   . Fear of Current or Ex-Partner:   . Emotionally Abused:   Marland Kitchen Physically Abused:   . Sexually Abused:     Review of Systems: See HPI, otherwise negative ROS  Physical Exam: BP (!) 150/84   Pulse 65   Temp (!) 96.6 F (35.9 C) (Temporal)   Resp 18   Ht 5\' 3"  (1.6 m)   Wt 128.8 kg   SpO2 100%   BMI 50.31 kg/m  General:   Alert,  pleasant and cooperative in NAD Head:  Normocephalic and atraumatic. Neck:  Supple; no masses or thyromegaly. Lungs:  Clear throughout to auscultation.    Heart:  Regular rate and rhythm. Abdomen:  Soft, nontender and nondistended. Normal bowel sounds, without guarding, and without rebound.   Neurologic:  Alert and  oriented x4;  grossly normal neurologically.  Impression/Plan: Erin Miller is now here to undergo a screening colonoscopy.  Risks, benefits, and alternatives regarding colonoscopy have been reviewed with the patient.  Questions have been answered.  All parties agreeable.

## 2020-07-03 NOTE — Anesthesia Preprocedure Evaluation (Addendum)
Anesthesia Evaluation  Patient identified by MRN, date of birth, ID band Patient awake    Reviewed: Allergy & Precautions, H&P , NPO status , Patient's Chart, lab work & pertinent test results  Airway Mallampati: III  TM Distance: >3 FB     Dental  (+) Teeth Intact   Pulmonary asthma , sleep apnea ,    breath sounds clear to auscultation       Cardiovascular hypertension,  Rhythm:regular Rate:Normal     Neuro/Psych negative neurological ROS  negative psych ROS   GI/Hepatic negative GI ROS, Neg liver ROS,   Endo/Other  Morbid obesity (super morbid obesity)  Renal/GU negative Renal ROS  negative genitourinary   Musculoskeletal   Abdominal   Peds  Hematology negative hematology ROS (+)   Anesthesia Other Findings Past Medical History: No date: Hyperlipidemia No date: Polycystic ovarian syndrome No date: PVC (premature ventricular contraction) No date: Sleep apnea No date: Sleep apnea  Past Surgical History: 1981: APPENDECTOMY 09/2014: BREAST BIOPSY; Left     Comment:  stereo of left breast mass done by Dr. Bary Castilla. Benign 2008, 2010: CESAREAN SECTION 10-11-08: HERNIA REPAIR     Comment:  umbilical 5697: LASIK; Bilateral 03-19-06: UTERINE FIBROID SURGERY  BMI    Body Mass Index: 50.31 kg/m      Reproductive/Obstetrics negative OB ROS                            Anesthesia Physical Anesthesia Plan  ASA: III  Anesthesia Plan: General   Post-op Pain Management:    Induction:   PONV Risk Score and Plan: Propofol infusion and TIVA  Airway Management Planned: Nasal CPAP  Additional Equipment:   Intra-op Plan:   Post-operative Plan:   Informed Consent: I have reviewed the patients History and Physical, chart, labs and discussed the procedure including the risks, benefits and alternatives for the proposed anesthesia with the patient or authorized representative who has  indicated his/her understanding and acceptance.     Dental Advisory Given  Plan Discussed with: Anesthesiologist, CRNA and Surgeon  Anesthesia Plan Comments:        Anesthesia Quick Evaluation

## 2020-07-03 NOTE — Transfer of Care (Signed)
Immediate Anesthesia Transfer of Care Note  Patient: Erin Miller  Procedure(s) Performed: COLONOSCOPY WITH PROPOFOL (N/A )  Patient Location: PACU and Endoscopy Unit  Anesthesia Type:General  Level of Consciousness: awake, alert  and oriented  Airway & Oxygen Therapy: Patient Spontanous Breathing  Post-op Assessment: Report given to RN and Post -op Vital signs reviewed and stable  Post vital signs: Reviewed and stable  Last Vitals:  Vitals Value Taken Time  BP 112/65 07/03/20 1151  Temp 36 C 07/03/20 1151  Pulse 64 07/03/20 1153  Resp 16 07/03/20 1153  SpO2 100 % 07/03/20 1153  Vitals shown include unvalidated device data.  Last Pain:  Vitals:   07/03/20 1151  TempSrc:   PainSc: 0-No pain         Complications: No complications documented.

## 2020-07-04 ENCOUNTER — Encounter: Payer: Self-pay | Admitting: Gastroenterology

## 2020-07-04 LAB — SURGICAL PATHOLOGY

## 2020-07-04 NOTE — Anesthesia Postprocedure Evaluation (Signed)
Anesthesia Post Note  Patient: Erin Miller  Procedure(s) Performed: COLONOSCOPY WITH PROPOFOL (N/A )  Patient location during evaluation: PACU Anesthesia Type: General Level of consciousness: awake and alert Pain management: pain level controlled Vital Signs Assessment: post-procedure vital signs reviewed and stable Respiratory status: spontaneous breathing, nonlabored ventilation and respiratory function stable Cardiovascular status: blood pressure returned to baseline and stable Postop Assessment: no apparent nausea or vomiting Anesthetic complications: no   No complications documented.   Last Vitals:  Vitals:   07/03/20 1201 07/03/20 1221  BP: 129/88 (!) 146/84  Pulse: 67   Resp: (!) 22   Temp:    SpO2: 99%     Last Pain:  Vitals:   07/03/20 1221  TempSrc:   PainSc: 0-No pain                 Brett Canales Hadiyah Maricle

## 2020-07-09 ENCOUNTER — Encounter: Payer: Self-pay | Admitting: Gastroenterology

## 2020-07-31 ENCOUNTER — Encounter: Payer: Self-pay | Admitting: Gastroenterology

## 2020-08-01 ENCOUNTER — Other Ambulatory Visit: Payer: Self-pay | Admitting: Physician Assistant

## 2020-08-01 ENCOUNTER — Ambulatory Visit
Admission: RE | Admit: 2020-08-01 | Discharge: 2020-08-01 | Disposition: A | Discharge status: Home / Self Care | Payer: BC Managed Care – PPO | Source: Ambulatory Visit | Attending: Physician Assistant | Admitting: Physician Assistant

## 2020-08-01 ENCOUNTER — Other Ambulatory Visit: Payer: Self-pay

## 2020-08-01 DIAGNOSIS — G510 Bell's palsy: Secondary | ICD-10-CM

## 2020-08-01 DIAGNOSIS — K120 Recurrent oral aphthae: Secondary | ICD-10-CM

## 2020-08-01 DIAGNOSIS — R29810 Facial weakness: Secondary | ICD-10-CM | POA: Insufficient documentation

## 2020-08-01 MED ORDER — GADOBUTROL 1 MMOL/ML IV SOLN
10.0000 mL | Freq: Once | INTRAVENOUS | Status: AC | PRN
  Administered 2020-08-01: 10 mL via INTRAVENOUS

## 2020-08-02 ENCOUNTER — Ambulatory Visit: Payer: BC Managed Care – PPO

## 2020-10-02 ENCOUNTER — Other Ambulatory Visit: Payer: Self-pay | Admitting: Internal Medicine

## 2020-10-02 DIAGNOSIS — Z1231 Encounter for screening mammogram for malignant neoplasm of breast: Secondary | ICD-10-CM

## 2020-11-04 IMAGING — MR MR HEAD WO/W CM
8 series · 48 of 48 positions shown · IV contrast (gadavist)
Comparison: None.

CLINICAL DATA: Left-sided facial droop 1 day duration.

EXAM:
MRI HEAD WITHOUT AND WITH CONTRAST
TECHNIQUE: Multiplanar, multiecho pulse sequences of the brain and surrounding
structures were obtained without and with intravenous contrast.
CONTRAST:  10mL GADAVIST GADOBUTROL 1 MMOL/ML IV SOLN

[Series 8: cor dwi_adc · coronal · 5.0mm · 0.68mm/px · 4 of 40 slices shown]
[im 1/40]
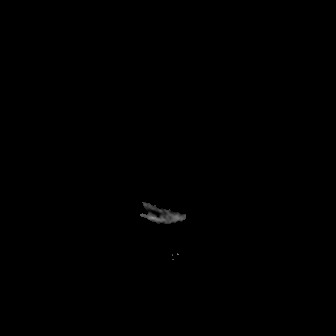
[im 14/40]
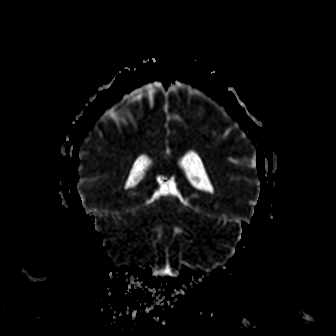
[im 27/40]
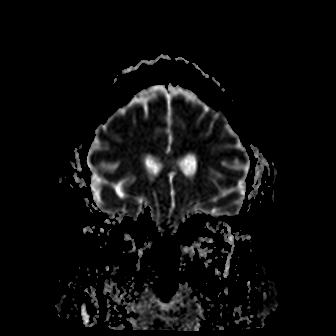
[im 40/40]
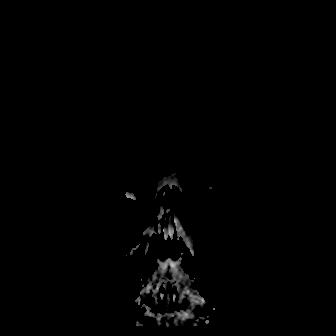

[Series 9: T1 · sagittal · 5.0mm · 0.94mm/px · 3 of 23 slices shown (1 of 2)]
[im 1/23]
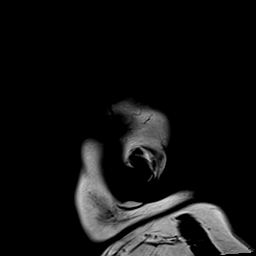
[im 12/23]
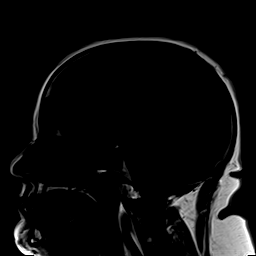
[im 23/23]
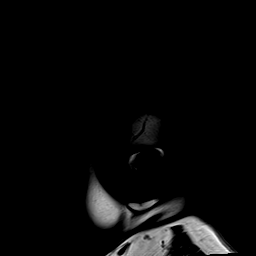

[Series 10: T2 · axial · 5.0mm · 0.53mm/px · z∈[-129,+14]mm · 3 of 25 slices shown]
[im 1/25]
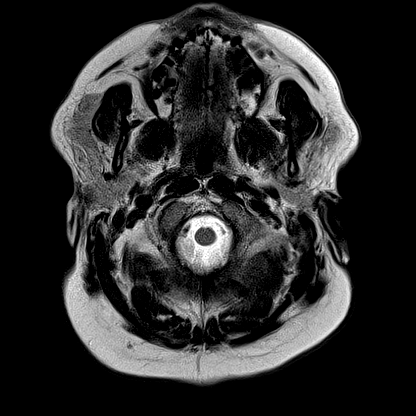
[im 13/25]
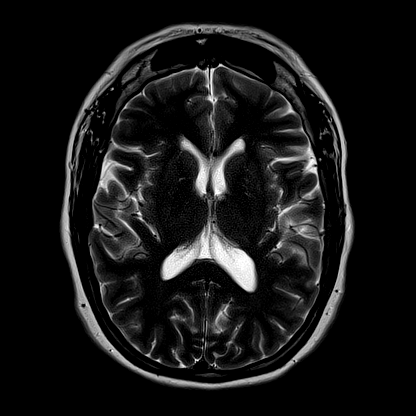
[im 25/25]
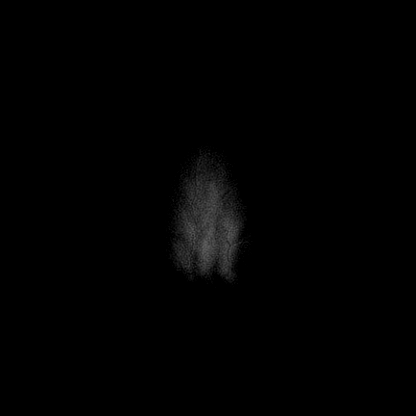

[Series 12: pha_images · axial · 3.0mm · 0.90mm/px · z∈[-133,+19]mm · 6 of 51 slices shown]
[im 1/51]
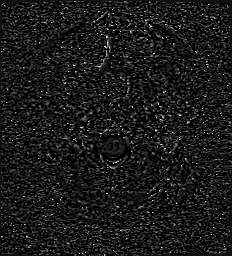
[im 11/51]
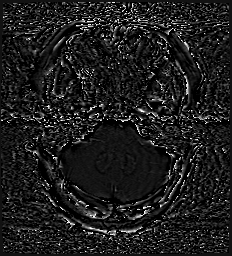
[im 21/51]
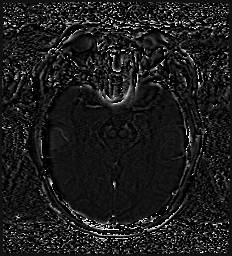
[im 31/51]
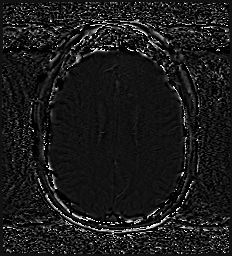
[im 41/51]
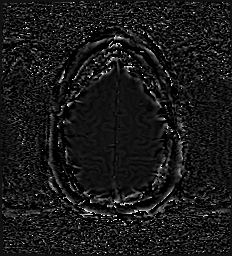
[im 51/51]
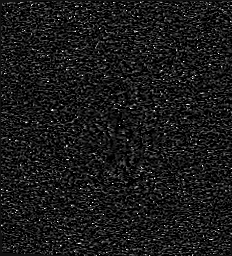

[Series 15: FLAIR · axial · 3.0mm · 0.69mm/px · z∈[-139,+14]mm · 6 of 52 slices shown]
[im 1/52]
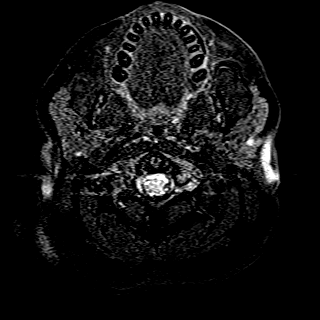
[im 11/52]
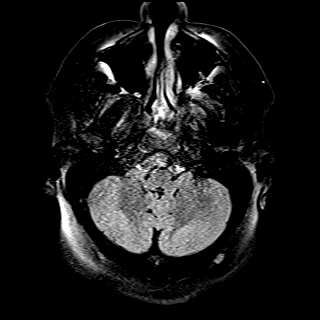
[im 21/52]
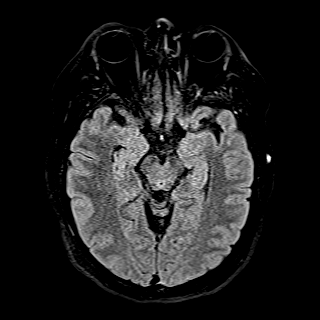
[im 31/52]
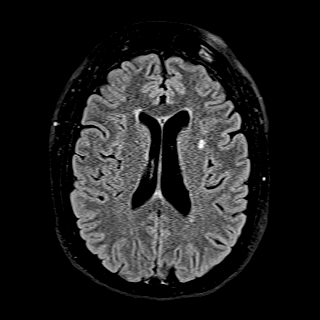
[im 41/52]
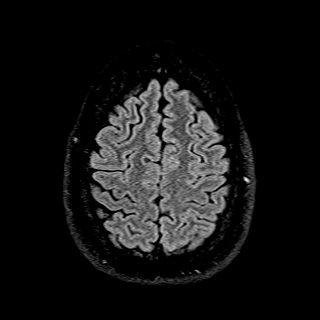
[im 52/52]
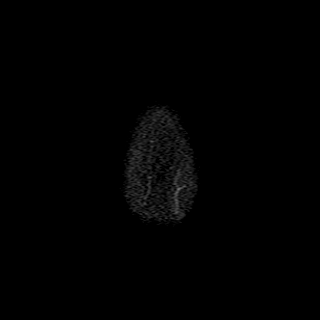

[Series 17: T1 · axial · 1.0mm · 0.98mm/px · z∈[-144,+30]mm · 20 of 175 slices shown (2 of 2)]
[im 1/175]
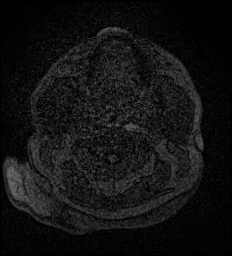
[im 10/175]
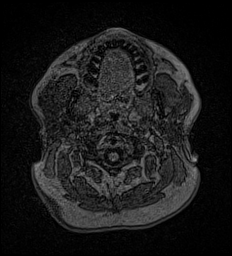
[im 19/175]
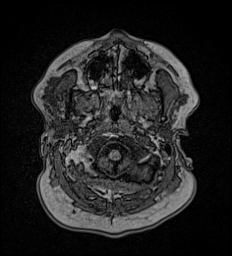
[im 28/175]
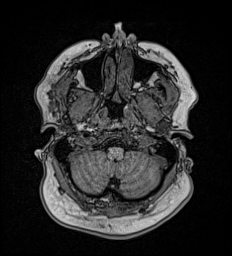
[im 37/175]
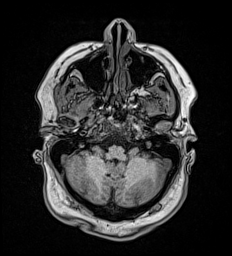
[im 46/175]
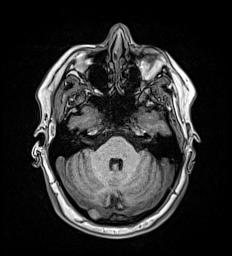
[im 55/175]
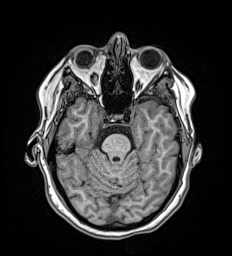
[im 65/175]
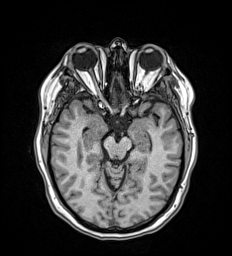
[im 74/175]
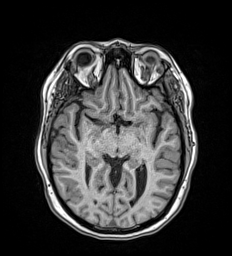
[im 83/175]
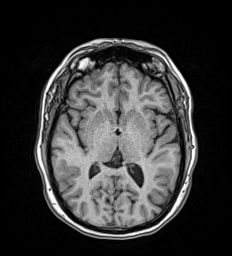
[im 92/175]
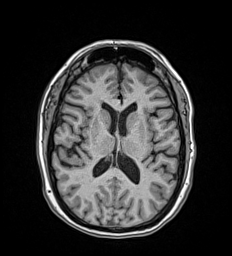
[im 101/175]
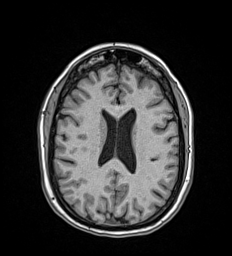
[im 110/175]
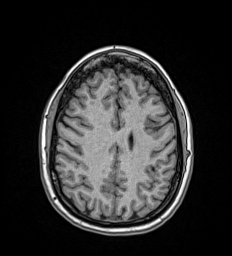
[im 120/175]
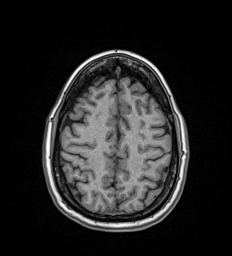
[im 129/175]
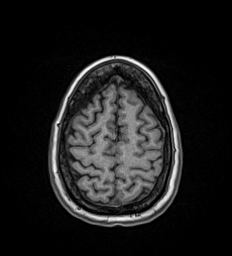
[im 138/175]
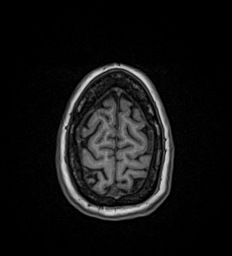
[im 147/175]
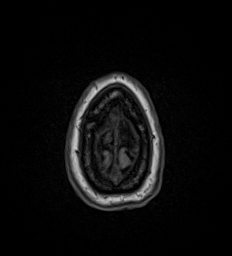
[im 156/175]
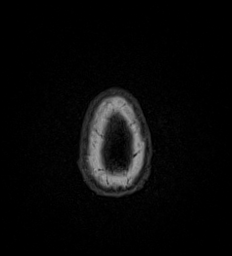
[im 165/175]
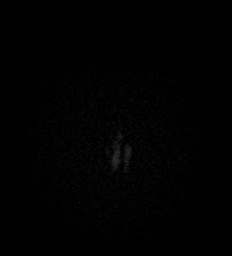
[im 175/175]
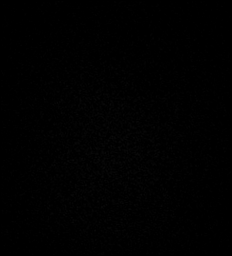

[Series 18: T2 post-contrast · coronal · 5.0mm · 0.57mm/px · 3 of 29 slices shown]
[im 1/29]
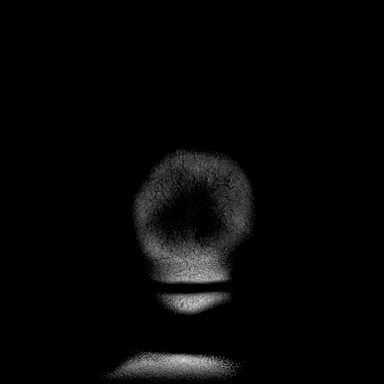
[im 15/29]
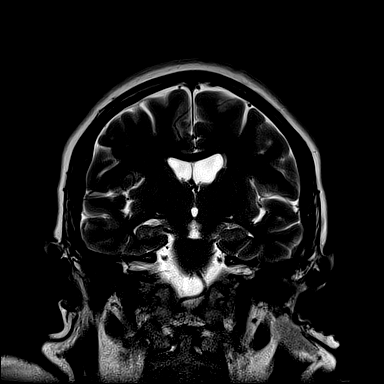
[im 29/29]
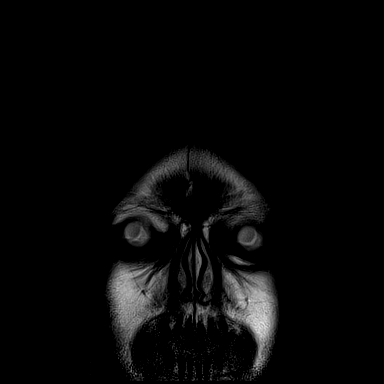

[Series 20: T1 post-contrast · coronal · 5.0mm · 0.57mm/px · 3 of 29 slices shown]
[im 1/29]
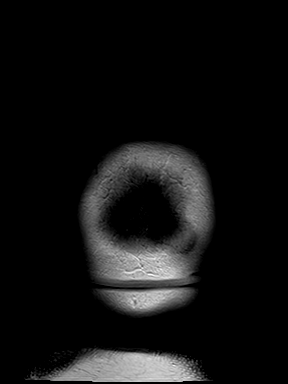
[im 15/29]
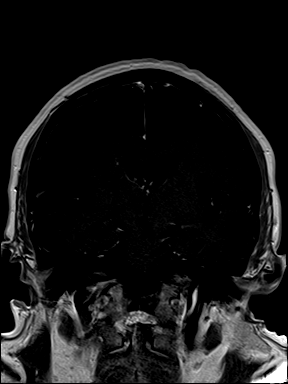
[im 29/29]
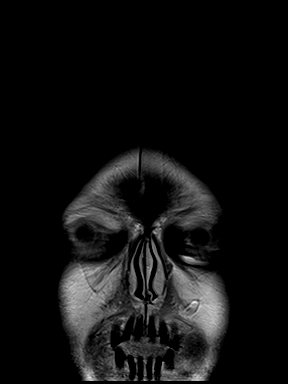

[48 of 48 positions shown; findings below may reference images not displayed]

FINDINGS: Brain: The brain has a normal appearance without evidence of
malformation, atrophy, old or acute small or large vessel
infarction, mass lesion, hemorrhage, hydrocephalus or extra-axial
collection. After contrast administration, no abnormal brain or
leptomeningeal enhancement occurs. There is mild enhancement of the
left facial nerve suggesting Bell's palsy.

Vascular: Major vessels at the base of the brain show flow. Venous
sinuses appear patent.

Skull and upper cervical spine: Normal.

Sinuses/Orbits: Clear/normal.

Other: None significant.
IMPRESSION: Normal appearance of the brain itself.

Mild enhancement of the left facial nerve suggesting Bell's palsy.

## 2020-11-06 ENCOUNTER — Ambulatory Visit
Admission: RE | Admit: 2020-11-06 | Discharge: 2020-11-06 | Disposition: A | Discharge status: Home / Self Care | Payer: BC Managed Care – PPO | Source: Ambulatory Visit | Attending: Internal Medicine | Admitting: Internal Medicine

## 2020-11-06 ENCOUNTER — Other Ambulatory Visit: Payer: Self-pay

## 2020-11-06 DIAGNOSIS — Z1231 Encounter for screening mammogram for malignant neoplasm of breast: Secondary | ICD-10-CM | POA: Insufficient documentation

## 2021-11-06 ENCOUNTER — Other Ambulatory Visit: Payer: Self-pay | Admitting: Internal Medicine

## 2021-11-06 DIAGNOSIS — Z1231 Encounter for screening mammogram for malignant neoplasm of breast: Secondary | ICD-10-CM

## 2022-01-09 ENCOUNTER — Ambulatory Visit
Admission: RE | Admit: 2022-01-09 | Discharge: 2022-01-09 | Disposition: A | Discharge status: Home / Self Care | Payer: BC Managed Care – PPO | Source: Ambulatory Visit | Attending: Internal Medicine | Admitting: Internal Medicine

## 2022-01-09 ENCOUNTER — Other Ambulatory Visit: Payer: Self-pay

## 2022-01-09 DIAGNOSIS — Z1231 Encounter for screening mammogram for malignant neoplasm of breast: Secondary | ICD-10-CM | POA: Insufficient documentation

## 2022-11-25 ENCOUNTER — Other Ambulatory Visit: Payer: Self-pay | Admitting: Internal Medicine

## 2022-11-25 DIAGNOSIS — Z1231 Encounter for screening mammogram for malignant neoplasm of breast: Secondary | ICD-10-CM

## 2023-01-13 ENCOUNTER — Ambulatory Visit
Admission: RE | Admit: 2023-01-13 | Discharge: 2023-01-13 | Disposition: A | Discharge status: Home / Self Care | Payer: BC Managed Care – PPO | Source: Ambulatory Visit | Attending: Internal Medicine | Admitting: Internal Medicine

## 2023-01-13 DIAGNOSIS — Z1231 Encounter for screening mammogram for malignant neoplasm of breast: Secondary | ICD-10-CM | POA: Diagnosis present

## 2023-06-12 LAB — EXTERNAL GENERIC LAB PROCEDURE: COLOGUARD: NEGATIVE

## 2023-06-12 LAB — COLOGUARD: COLOGUARD: NEGATIVE

## 2023-12-01 ENCOUNTER — Other Ambulatory Visit: Payer: Self-pay | Admitting: Internal Medicine

## 2023-12-01 DIAGNOSIS — Z1231 Encounter for screening mammogram for malignant neoplasm of breast: Secondary | ICD-10-CM

## 2024-01-26 ENCOUNTER — Ambulatory Visit
Admission: RE | Admit: 2024-01-26 | Discharge: 2024-01-26 | Disposition: A | Discharge status: Home / Self Care | Payer: 59 | Source: Ambulatory Visit | Attending: Internal Medicine | Admitting: Internal Medicine

## 2024-01-26 DIAGNOSIS — Z1231 Encounter for screening mammogram for malignant neoplasm of breast: Secondary | ICD-10-CM | POA: Diagnosis present

## 2024-02-26 ENCOUNTER — Other Ambulatory Visit: Payer: Self-pay | Admitting: Obstetrics and Gynecology

## 2024-02-26 DIAGNOSIS — Z1231 Encounter for screening mammogram for malignant neoplasm of breast: Secondary | ICD-10-CM
